# Patient Record
Sex: Male | Born: 2000 | Race: Black or African American | Hispanic: No | Marital: Single | Smoking: Never smoker
Health system: Southern US, Community
[De-identification: ages and names within clinical notes are randomized; demographics above are authoritative.]

## PROBLEM LIST (undated history)

## (undated) DIAGNOSIS — F32A Depression, unspecified: Secondary | ICD-10-CM

## (undated) DIAGNOSIS — R519 Headache, unspecified: Secondary | ICD-10-CM

## (undated) DIAGNOSIS — J039 Acute tonsillitis, unspecified: Secondary | ICD-10-CM

## (undated) DIAGNOSIS — R51 Headache: Secondary | ICD-10-CM

## (undated) DIAGNOSIS — F419 Anxiety disorder, unspecified: Secondary | ICD-10-CM

---
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
## 2021-10-03 ENCOUNTER — Other Ambulatory Visit: Payer: Self-pay

## 2021-10-03 ENCOUNTER — Encounter (HOSPITAL_COMMUNITY): Payer: Self-pay | Admitting: Emergency Medicine

## 2021-10-03 ENCOUNTER — Ambulatory Visit (HOSPITAL_COMMUNITY)
Admission: EM | Admit: 2021-10-03 | Discharge: 2021-10-03 | Disposition: A | Discharge status: Home / Self Care | Payer: BC Managed Care – PPO | Attending: Nurse Practitioner | Admitting: Nurse Practitioner

## 2021-10-03 DIAGNOSIS — F32A Depression, unspecified: Secondary | ICD-10-CM

## 2021-10-03 DIAGNOSIS — F419 Anxiety disorder, unspecified: Secondary | ICD-10-CM

## 2021-10-03 DIAGNOSIS — A084 Viral intestinal infection, unspecified: Secondary | ICD-10-CM

## 2021-10-03 MED ORDER — ONDANSETRON 4 MG PO TBDP: 4.0000 mg | ORAL_TABLET | Freq: Three times a day (TID) | ORAL | 0 refills | Status: DC | PRN

## 2021-10-03 NOTE — ED Triage Notes (Signed)
Abdominal pain for 2 days.  Diarrhea intermittently for 3-4 days.  Reports 1-2 episodes of vomiting.   ?

## 2021-10-03 NOTE — ED Provider Notes (Signed)
?[facility] ? ? ? ?CSN: [id number] ?Arrival date & time: 10/03/21  1058 ? ? ?  ? ?History   ?Chief Complaint ?Chief Complaint  ?Patient presents with  ? Abdominal Pain  ? ? ?HPI ?Joshua Soto is a 21 y.o. male.  ? ?Patient presents with 2 concerns today.  First, he reports he has been suffering with anxiety and depression his whole life.  He has been using marijuana to self treat for his anxiety and depression.  He tells me today he is attempted suicide multiple times in the past.  Today, he is not having any active thoughts of hurting or killing himself.  He also denies any plan to hurt or kill himself.   ? ?Second, he has been having lower abdominal pain, diarrhea, and intermittent vomiting for the past few days.  He denies any blood in his vomit or stool.  He has been drinking plenty of water and this has been staying down, however he tried to eat a burger the other day and he vomited.  He denies chest pain, shortness of breath, and dizziness.   ? ? ?Past Medical History:  ?Diagnosis Date  ? Tonsillitis   ? Worsening headaches   ? ? ?There are no problems to display for this patient. ? ? ?History reviewed. No pertinent surgical history. ? ? ? ? ?Home Medications   ? ?Prior to Admission medications   ?Medication Sig Start Date End Date Taking? Authorizing Provider  ?ondansetron (ZOFRAN-ODT) 4 MG disintegrating tablet Take 1 tablet (4 mg total) by mouth every 8 (eight) hours as needed for nausea or vomiting. 10/03/21  Yes Eulogio Bear, NP  ?cetirizine (ZYRTEC) 10 MG tablet Take 10 mg by mouth daily.    [provider]  ?fluticasone (FLONASE) 50 MCG/ACT nasal spray Place 2 sprays into the nose daily.    [provider]  ?hyoscyamine (ANASPAZ) 0.125 MG TBDP disintergrating tablet Place 1 tablet (0.125 mg total) under the tongue every 6 (six) hours as needed. 02/18/19   Levin Erp, PA  ?Naproxen (NAPROXEN) 375 MG TBEC Take 1 tablet by mouth 2 (two) times daily as needed  (for headache).    [provider]  ? ? ?Family History ?Family History  ?Problem Relation Age of Onset  ? Diabetes Maternal Grandfather   ? Kidney disease Maternal Uncle   ? ? ?Social History ?Social History  ? ?Tobacco Use  ? Smoking status: Never  ? Smokeless tobacco: Never  ?Vaping Use  ? Vaping Use: Every day  ?Substance Use Topics  ? Alcohol use: No  ? Drug use: Yes  ?  Types: Marijuana  ? ? ? ?Allergies   ?Patient has no known allergies. ? ? ?Review of Systems ?Review of Systems ?Per HPI ? ?Physical Exam ?Triage Vital Signs ?ED Triage Vitals  ?Enc Vitals Group  ?   BP 10/03/21 1153 117/71  ?   Pulse Rate 10/03/21 1153 75  ?   Resp 10/03/21 1153 18  ?   Temp 10/03/21 1153 98.4 ?F (36.9 ?C)  ?   Temp Source 10/03/21 1153 Oral  ?   SpO2 10/03/21 1153 97 %  ?   Weight --   ?   Height --   ?   Head Circumference --   ?   Peak Flow --   ?   Pain Score 10/03/21 1148 7  ?   Pain Loc --   ?   Pain Edu? --   ?  Excl. in [place]? --   ? ?No data found. ? ?Updated Vital Signs ?BP 117/71 (BP Location: Right Arm)   Pulse 75   Temp 98.4 ?F (36.9 ?C) (Oral)   Resp 18   SpO2 97%  ? ?Visual Acuity ?Right Eye Distance:   ?Left Eye Distance:   ?Bilateral Distance:   ? ?Right Eye Near:   ?Left Eye Near:    ?Bilateral Near:    ? ?Physical Exam ?Vitals and nursing note reviewed.  ?Constitutional:   ?   General: He is not in acute distress. ?   Appearance: Normal appearance. He is not toxic-appearing.  ?HENT:  ?   Head: Normocephalic and atraumatic.  ?   Mouth/Throat:  ?   Mouth: Mucous membranes are moist.  ?   Pharynx: Oropharynx is clear. No posterior oropharyngeal erythema.  ?Cardiovascular:  ?   Rate and Rhythm: Normal rate and regular rhythm.  ?Pulmonary:  ?   Effort: Pulmonary effort is normal. No respiratory distress.  ?   Breath sounds: Normal breath sounds. No wheezing, rhonchi or rales.  ?Abdominal:  ?   General: Abdomen is flat. Bowel sounds are normal. There is no distension.  ?   Palpations: Abdomen is soft.   ?   Tenderness: There is no abdominal tenderness. There is no right CVA tenderness, left CVA tenderness, guarding or rebound.  ?Musculoskeletal:  ?   Cervical back: Normal range of motion.  ?Lymphadenopathy:  ?   Cervical: No cervical adenopathy.  ?Skin: ?   General: Skin is warm and dry.  ?   Capillary Refill: Capillary refill takes less than 2 seconds.  ?   Coloration: Skin is not jaundiced or pale.  ?   Findings: No erythema.  ?Neurological:  ?   Mental Status: He is alert.  ?   Motor: No weakness.  ?   Gait: Gait normal.  ?Psychiatric:     ?   Attention and Perception: Attention normal.     ?   Mood and Affect: Affect is flat.     ?   Speech: Speech normal.     ?   Behavior: Behavior is cooperative.  ? ? ? ?UC Treatments / Results  ?Labs ?(all labs ordered are listed, but only abnormal results are displayed) ?Labs Reviewed - No data to display ? ?EKG ? ? ?Radiology ?No results found. ? ?Procedures ?Procedures (including critical care time) ? ?Medications Ordered in UC ?Medications - No data to display ? ?Initial Impression / Assessment and Plan / UC Course  ?I have reviewed the triage vital signs and the nursing notes. ? ?Pertinent labs & imaging results that were available during my care of the patient were reviewed by me and considered in my medical decision making (see chart for details). ? ?  ?Suspect viral gastroenteritis as cause of nausea/vomiting, and abdominal pain.  Encouraged plenty of hydration, soft foods, and can use ondansetron 4 mg every 8 hours as needed for nausea/vomiting.  ? ?Regarding anxiety and depression, I encouraged establishing care with primary care provider to discuss medication.  We attempted to make an appointment with a primary care provider during today's office visit, however the patient did not know his work schedule for the next week.  He denies suicidal plan or intent today.  I gave him a list of behavioral health resources and encouraged him to reach out to these resources  if he starts having suicidal plan or intent.  The patient was given the opportunity to  ask questions.  All questions answered to their satisfaction.  The patient is in agreement to this plan.  ? ?Final Clinical Impressions(s) / UC Diagnoses  ? ?Final diagnoses:  ?Anxiety and depression  ?Viral gastroenteritis  ? ? ? ?Discharge Instructions   ? ?  ?- See the list of mental health resources; it is attached to this packet of information ?- Please schedule an appointment with a PCP as soon as you know your work schedule for next week ?- Continue pushing hydration with plenty of fluids.  You can use ondansetron 4 mg under your tongue every 8 hours as needed for nausea/vomiting ?- Diarrhea, abdominal pain, and nausea/vomiting should resolve over the next few days.  Please seek care if this does not occur. ? ? ? ?ED Prescriptions   ? ? Medication Sig Dispense Auth. Provider  ? ondansetron (ZOFRAN-ODT) 4 MG disintegrating tablet Take 1 tablet (4 mg total) by mouth every 8 (eight) hours as needed for nausea or vomiting. 20 tablet Eulogio Bear, NP  ? ?  ? ?PDMP not reviewed this encounter. ?  ?Eulogio Bear, NP ?10/03/21 1301 ? ?

## 2021-10-03 NOTE — Discharge Instructions (Addendum)
-   See the list of mental health resources; it is attached to this packet of information ?- Please schedule an appointment with a PCP as soon as you know your work schedule for next week ?- Continue pushing hydration with plenty of fluids.  You can use ondansetron 4 mg under your tongue every 8 hours as needed for nausea/vomiting ?- Diarrhea, abdominal pain, and nausea/vomiting should resolve over the next few days.  Please seek care if this does not occur. ?

## 2021-10-15 ENCOUNTER — Encounter (HOSPITAL_COMMUNITY): Payer: Self-pay

## 2021-10-15 ENCOUNTER — Emergency Department (HOSPITAL_COMMUNITY)
Admission: EM | Admit: 2021-10-15 | Discharge: 2021-10-15 | Disposition: A | Discharge status: Home / Self Care | Payer: BC Managed Care – PPO | Attending: Student | Admitting: Student

## 2021-10-15 ENCOUNTER — Other Ambulatory Visit: Payer: Self-pay

## 2021-10-15 ENCOUNTER — Emergency Department (HOSPITAL_COMMUNITY): Payer: BC Managed Care – PPO

## 2021-10-15 DIAGNOSIS — D72829 Elevated white blood cell count, unspecified: Secondary | ICD-10-CM | POA: Insufficient documentation

## 2021-10-15 DIAGNOSIS — R404 Transient alteration of awareness: Secondary | ICD-10-CM | POA: Diagnosis not present

## 2021-10-15 DIAGNOSIS — R030 Elevated blood-pressure reading, without diagnosis of hypertension: Secondary | ICD-10-CM | POA: Diagnosis not present

## 2021-10-15 DIAGNOSIS — R0682 Tachypnea, not elsewhere classified: Secondary | ICD-10-CM | POA: Insufficient documentation

## 2021-10-15 DIAGNOSIS — R944 Abnormal results of kidney function studies: Secondary | ICD-10-CM | POA: Insufficient documentation

## 2021-10-15 DIAGNOSIS — R519 Headache, unspecified: Secondary | ICD-10-CM | POA: Insufficient documentation

## 2021-10-15 DIAGNOSIS — R569 Unspecified convulsions: Secondary | ICD-10-CM | POA: Diagnosis not present

## 2021-10-15 DIAGNOSIS — F17200 Nicotine dependence, unspecified, uncomplicated: Secondary | ICD-10-CM | POA: Diagnosis not present

## 2021-10-15 DIAGNOSIS — E871 Hypo-osmolality and hyponatremia: Secondary | ICD-10-CM | POA: Diagnosis not present

## 2021-10-15 DIAGNOSIS — R7401 Elevation of levels of liver transaminase levels: Secondary | ICD-10-CM | POA: Insufficient documentation

## 2021-10-15 DIAGNOSIS — I10 Essential (primary) hypertension: Secondary | ICD-10-CM | POA: Diagnosis not present

## 2021-10-15 DIAGNOSIS — R000 Tachycardia, unspecified: Secondary | ICD-10-CM | POA: Diagnosis not present

## 2021-10-15 LAB — COMPREHENSIVE METABOLIC PANEL
ALT: 69 U/L — ABNORMAL HIGH (ref 0–44)
AST: 114 U/L — ABNORMAL HIGH (ref 15–41)
Albumin: 5 g/dL (ref 3.5–5.0)
Alkaline Phosphatase: 67 U/L (ref 38–126)
Anion gap: 17 — ABNORMAL HIGH (ref 5–15)
BUN: 9 mg/dL (ref 6–20)
CO2: 16 mmol/L — ABNORMAL LOW (ref 22–32)
Calcium: 9.8 mg/dL (ref 8.9–10.3)
Chloride: 100 mmol/L (ref 98–111)
Creatinine, Ser: 1.49 mg/dL — ABNORMAL HIGH (ref 0.61–1.24)
GFR, Estimated: >60 mL/min (ref >60)
Glucose, Bld: 184 mg/dL — ABNORMAL HIGH (ref 70–99)
Potassium: 3.8 mmol/L (ref 3.5–5.1)
Sodium: 133 mmol/L — ABNORMAL LOW (ref 135–145)
Total Bilirubin: 1 mg/dL (ref 0.3–1.2)
Total Protein: 8.2 g/dL — ABNORMAL HIGH (ref 6.5–8.1)

## 2021-10-15 LAB — CBC WITH DIFFERENTIAL/PLATELET
Abs Immature Granulocytes: 0.14 10*3/uL — ABNORMAL HIGH (ref 0.00–0.07)
Basophils Absolute: 0 10*3/uL (ref 0.0–0.1)
Basophils Relative: 0 %
Eosinophils Absolute: 0.1 10*3/uL (ref 0.0–0.5)
Eosinophils Relative: 0 %
HCT: 49.1 % (ref 39.0–52.0)
Hemoglobin: 15.8 g/dL (ref 13.0–17.0)
Immature Granulocytes: 1 %
Lymphocytes Relative: 25 %
Lymphs Abs: 3.8 10*3/uL (ref 0.7–4.0)
MCH: 28.5 pg (ref 26.0–34.0)
MCHC: 32.2 g/dL (ref 30.0–36.0)
MCV: 88.6 fL (ref 80.0–100.0)
Monocytes Absolute: 0.7 10*3/uL (ref 0.1–1.0)
Monocytes Relative: 5 %
Neutro Abs: 10.2 10*3/uL — ABNORMAL HIGH (ref 1.7–7.7)
Neutrophils Relative %: 69 %
Platelets: 231 10*3/uL (ref 150–400)
RBC: 5.54 MIL/uL (ref 4.22–5.81)
RDW: 12.9 % (ref 11.5–15.5)
WBC: 14.9 10*3/uL — ABNORMAL HIGH (ref 4.0–10.5)
nRBC: 0 % (ref 0.0–0.2)

## 2021-10-15 LAB — RAPID URINE DRUG SCREEN, HOSP PERFORMED
Amphetamines: NOT DETECTED
Barbiturates: NOT DETECTED
Benzodiazepines: POSITIVE — AB
Cocaine: NOT DETECTED
Opiates: NOT DETECTED
Tetrahydrocannabinol: POSITIVE — AB

## 2021-10-15 MED ORDER — LACTATED RINGERS IV BOLUS
1000.0000 mL | Freq: Once | INTRAVENOUS | Status: AC
  Administered 2021-10-15: 1000 mL via INTRAVENOUS

## 2021-10-15 MED ORDER — METOCLOPRAMIDE HCL 5 MG/ML IJ SOLN
10.0000 mg | Freq: Once | INTRAMUSCULAR | Status: AC
  Administered 2021-10-15: 10 mg via INTRAVENOUS
  Filled 2021-10-15: qty 2

## 2021-10-15 NOTE — ED Triage Notes (Signed)
Pt smoking marijuana with friends. Pt had 2 minute seizure involving shaking of arms and legs. Pt states he has no history of seizures. ?

## 2021-10-15 NOTE — ED Provider Notes (Signed)
?[facility] EMERGENCY DEPARTMENT ?Provider Note ? ? ?CSN: [id number] ?Arrival date & time: 10/15/21  1955 ? ?  ? ?History ? ?Chief Complaint  ?Patient presents with  ? Seizures  ? ? ?Joshua Soto is a 21 y.o. male. ? ?HPI ? ?Patient is a 21 year old male with a history of chronic headache presents to the emergency department after seizure activity.  Per family member, patient was smoking weed when the incident happened.  They report 2 minutes of generalized jerking motion in his upper and lower extremity.  His mother reports she has put him on the side because he was foaming at the mouth.  Denies any eye deviation.  No reported history of seizures.  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  Patient currently endorses bitemporal headache.  Denies neck pain or fever.  Denies chest pain or shortness of breath.  He does not remember exactly what happened.  No recent sickness.  EMS report he was postictal when they got him.  No intervention prior to arrival. Otherwise no other complaints. ? ?Home Medications ?Prior to Admission medications   ?Medication Sig Start Date End Date Taking? Authorizing Provider  ?cetirizine (ZYRTEC) 10 MG tablet Take 10 mg by mouth daily.    [provider]  ?fluticasone (FLONASE) 50 MCG/ACT nasal spray Place 2 sprays into the nose daily.    [provider]  ?hyoscyamine (ANASPAZ) 0.125 MG TBDP disintergrating tablet Place 1 tablet (0.125 mg total) under the tongue every 6 (six) hours as needed. 02/18/19   Unk Lightning, PA  ?Naproxen (NAPROXEN) 375 MG TBEC Take 1 tablet by mouth 2 (two) times daily as needed (for headache).    [provider]  ?ondansetron (ZOFRAN-ODT) 4 MG disintegrating tablet Take 1 tablet (4 mg total) by mouth every 8 (eight) hours as needed for nausea or vomiting. 10/03/21   Valentino Nose, NP  ?   ? ?Allergies    ?Patient has no known allergies.   ? ?Review of Systems   ?Review of  Systems ? ?Physical Exam ?Updated Vital Signs ?BP (!) 108/95   Pulse 63   Temp 97.7 ?F (36.5 ?C)   Resp 20   Ht 5\' 10"  (1.778 m)   Wt 74 kg   SpO2 100%   BMI 23.41 kg/m?  ?Physical Exam ?Vitals and nursing note reviewed.  ?Constitutional:   ?   General: He is not in acute distress. ?   Appearance: He is well-developed.  ?   Comments: Tired appearing  ?HENT:  ?   Head: Normocephalic and atraumatic.  ?   Comments: Tongue bite marks without active bleeding or laceration ?Eyes:  ?   Conjunctiva/sclera: Conjunctivae normal.  ?Cardiovascular:  ?   Rate and Rhythm: Normal rate and regular rhythm.  ?   Heart sounds: No murmur heard. ?Pulmonary:  ?   Effort: Pulmonary effort is normal. No respiratory distress.  ?   Breath sounds: Normal breath sounds.  ?Abdominal:  ?   Palpations: Abdomen is soft.  ?   Tenderness: There is no abdominal tenderness.  ?Musculoskeletal:     ?   General: No swelling or tenderness.  ?   Cervical back: Neck supple.  ?Skin: ?   General: Skin is warm and dry.  ?   Capillary Refill: Capillary refill takes less than 2 seconds.  ?Neurological:  ?   General: No focal deficit present.  ?   Mental Status: He is alert and  oriented to person, place, and time.  ?   Sensory: No sensory deficit.  ?   Comments: Tired appearing, however alert and oriented x3.  Follows command.  GCS of 15.  ?Psychiatric:     ?   Mood and Affect: Mood normal.  ? ? ?ED Results / Procedures / Treatments   ?Labs ?(all labs ordered are listed, but only abnormal results are displayed) ?Labs Reviewed  ?COMPREHENSIVE METABOLIC PANEL - Abnormal; Notable for the following components:  ?    Result Value  ? Sodium 133 (*)   ? CO2 16 (*)   ? Glucose, Bld 184 (*)   ? Creatinine, Ser 1.49 (*)   ? Total Protein 8.2 (*)   ? AST 114 (*)   ? ALT 69 (*)   ? Anion gap 17 (*)   ? All other components within normal limits  ?CBC WITH DIFFERENTIAL/PLATELET - Abnormal; Notable for the following components:  ? WBC 14.9 (*)   ? Neutro Abs 10.2 (*)   ?  Abs Immature Granulocytes 0.14 (*)   ? All other components within normal limits  ?RAPID URINE DRUG SCREEN, HOSP PERFORMED - Abnormal; Notable for the following components:  ? Benzodiazepines POSITIVE (*)   ? Tetrahydrocannabinol POSITIVE (*)   ? All other components within normal limits  ? ? ?EKG ?None ? ?Radiology ?CT Head Wo Contrast ? ?Result Date: 10/15/2021 ?CLINICAL DATA:  Seizure disorder EXAM: CT HEAD WITHOUT CONTRAST TECHNIQUE: Contiguous axial images were obtained from the base of the skull through the vertex without intravenous contrast. RADIATION DOSE REDUCTION: This exam was performed according to the departmental dose-optimization program which includes automated exposure control, adjustment of the mA and/or kV according to patient size and/or use of iterative reconstruction technique. COMPARISON:  02/28/2013 FINDINGS: Brain: No acute intracranial findings are seen. There are no signs of bleeding within the cranium. Ventricles are not dilated. There is no focal mass effect. Vascular: Unremarkable. Skull: Unremarkable. Sinuses/Orbits: There is mucosal thickening in the ethmoid and right maxillary sinuses. Other: None IMPRESSION: No acute intracranial findings are seen in noncontrast CT brain. Chronic ethmoid and right maxillary sinusitis. Electronically Signed   By: Ernie Avena M.D.   On: 10/15/2021 20:57   ? ?Procedures ?Procedures  ? ?Medications Ordered in ED ?Medications  ?lactated ringers bolus 1,000 mL (1,000 mLs Intravenous New Bag/Given 10/15/21 2111)  ?metoCLOPramide (REGLAN) injection 10 mg (10 mg Intravenous Given 10/15/21 2111)  ? ? ?ED Course/ Medical Decision Making/ A&P ?  ?                        ?Medical Decision Making ?Problems Addressed: ?Seizure [facility]): acute illness or injury that poses a threat to life or bodily functions ? ?Amount and/or Complexity of Data Reviewed ?Labs: ordered. Decision-making details documented in ED Course. ?Radiology: ordered and independent  interpretation performed. Decision-making details documented in ED Course. ? ?Risk ?Prescription drug management. ? ? ?Patient is a 21 year old male presents to the emergency department for 2 minutes of seizure.  EMS reported sugar was 120.  On arrival, he is GCS 15.  He does appear tired.  Vital signs are remarkable for mild tachypnea and mildly elevated blood pressure.  No systemic signs such as tachycardia or fever.  No focal deficit on my exam. ? ?Patient seizure most likely in the setting of THC use/tox mediated.  I have low clinical suspicion for meningitis or other serious intracranial etiology at this time as his  CT head did not show any acute etiology.  Less concern for cancerous etiology causing seizure at this time.  No meningeal signs on my exam.  Less concern for meningitis at this time.  His CBC shows mildly elevated leukocyte at 14.9.  CMP showed mild hyponatremia.  Creatinine is also mildly elevated.  No baseline to compare at this time.  AST and ALT are mildly elevated.  UDS positive for benzo and THC. ? ?Give patient Reglan and LR bolus for symptomatic management of his headache.  ? ?Believe seizure most likely in the setting of tach/substance use.  Before discharge, patient is back to baseline.  Informed him to follow-up with PCP in 1 to 2 days.  Strict return precaution has been discussed. ? ? ?Final Clinical Impression(s) / ED Diagnoses ?Final diagnoses:  ?Seizure (HCC)  ? ? ?Rx / DC Orders ?ED Discharge Orders   ? ? None  ? ?  ? ? ?  ?Amayia Ciano, TewodrJari Sportsmanos T, MD ?10/15/21 2234 ? ?  ?Glendora ScoreKommor, [place], MD ?10/15/21 2333 ? ?

## 2021-10-30 ENCOUNTER — Encounter: Payer: Self-pay | Admitting: Physician Assistant

## 2021-10-30 ENCOUNTER — Ambulatory Visit: Payer: BC Managed Care – PPO | Admitting: Physician Assistant

## 2021-10-30 VITALS — BP 112/74 | HR 98 | Temp 98.2°F | Ht 70.0 in | Wt 181.2 lb

## 2021-10-30 DIAGNOSIS — F32A Depression, unspecified: Secondary | ICD-10-CM | POA: Insufficient documentation

## 2021-10-30 DIAGNOSIS — F419 Anxiety disorder, unspecified: Secondary | ICD-10-CM | POA: Diagnosis not present

## 2021-10-30 DIAGNOSIS — R799 Abnormal finding of blood chemistry, unspecified: Secondary | ICD-10-CM

## 2021-10-30 DIAGNOSIS — D72829 Elevated white blood cell count, unspecified: Secondary | ICD-10-CM | POA: Diagnosis not present

## 2021-10-30 DIAGNOSIS — Z87898 Personal history of other specified conditions: Secondary | ICD-10-CM | POA: Insufficient documentation

## 2021-10-30 DIAGNOSIS — J309 Allergic rhinitis, unspecified: Secondary | ICD-10-CM | POA: Insufficient documentation

## 2021-10-30 DIAGNOSIS — Z6826 Body mass index (BMI) 26.0-26.9, adult: Secondary | ICD-10-CM

## 2021-10-30 MED ORDER — ALPRAZOLAM 0.5 MG PO TABS: 0.5000 mg | ORAL_TABLET | Freq: Two times a day (BID) | ORAL | 0 refills | Status: DC | PRN

## 2021-10-30 MED ORDER — BUPROPION HCL 75 MG PO TABS: 75.0000 mg | ORAL_TABLET | Freq: Every day | ORAL | 3 refills | Status: DC

## 2021-10-30 NOTE — Progress Notes (Signed)
? ?New Patient Office Visit ? ?Subjective:  ?Patient ID: Joshua Soto, male    DOB: 2000-09-19  Age: 21 y.o. MRN: [id number] ?0001 ? ?C:  ?Chief Complaint  ?Patient presents with  ? other  ?  New pt. Est. Also recently in the hospital had a seizure at apartment, stood up on phone and just feel over started seizing up, that was the first time that ever happened. After seizure bit tongue still a little numb. Pt. Stated he had not eaten in 2 days and had been awake for 2 days before he had the seizure that day.   ? ? ?HPI ?Joshua Soto presents to establish care and to follow up a visit to the ED after having a seizure; reports he had several days of not eating or sleeping and had stopped taking xanax for about a week when he had the seizure; states he has PTSD, a lot of anxiety and some depression, and has seen a lot in his lifetime; reports a history of a suicide attempt about 4 years ago, but denies suicidal ideation today; lost his Grandfather a few years ago; tried therapy years ago and went for 2 months but didn't find it helpful; for the past week has been sleeping about 6 hours at a time, but sometimes stays up all night; reports that he has nightmares due to anxiety or fears that he or a loved one will die; eats usually 2 times a day but sometimes only 1, also snacks. ? ? ?Outpatient Encounter Medications as of 10/30/2021  ?Medication Sig  ? albuterol (PROAIR HFA) 108 (90 Base) MCG/ACT inhaler INL 2 PFS PO Q 4 TO 6 H PRN  ? ALPRAZolam (XANAX) 0.5 MG tablet Take 1 tablet (0.5 mg total) by mouth 2 (two) times daily as needed for anxiety.  ? buPROPion (WELLBUTRIN) 75 MG tablet Take 1 tablet (75 mg total) by mouth daily.  ? cetirizine (ZYRTEC) 10 MG tablet   ? fluticasone (FLONASE) 50 MCG/ACT nasal spray Place 2 sprays into the nose daily.  ? fluticasone (FLOVENT HFA) 110 MCG/ACT inhaler INL 2 PFS PO BID  ? [DISCONTINUED] cetirizine (ZYRTEC) 10 MG tablet Take 10 mg by mouth daily.  ? [DISCONTINUED] fluticasone  (FLONASE) 50 MCG/ACT nasal spray Place into the nose.  ? [DISCONTINUED] hyoscyamine (ANASPAZ) 0.125 MG TBDP disintergrating tablet Place 1 tablet (0.125 mg total) under the tongue every 6 (six) hours as needed. (Patient not taking: Reported on 10/30/2021)  ? [DISCONTINUED] Naproxen 375 MG TBEC Take 1 tablet by mouth 2 (two) times daily as needed (for headache). (Patient not taking: Reported on 10/30/2021)  ? [DISCONTINUED] ondansetron (ZOFRAN-ODT) 4 MG disintegrating tablet Take 1 tablet (4 mg total) by mouth every 8 (eight) hours as needed for nausea or vomiting. (Patient not taking: Reported on 10/30/2021)  ? ?No facility-administered encounter medications on file as of 10/30/2021.  ? ? ?Past Medical History:  ?Diagnosis Date  ? Tonsillitis   ? Worsening headaches   ? ? ?History reviewed. No pertinent surgical history. ? ?Family History  ?Problem Relation Age of Onset  ? Diabetes Maternal Grandfather   ? Kidney disease Maternal Uncle   ? ? ?Social History  ? ?Socioeconomic History  ? Marital status: Single  ?  Spouse name: Not on file  ? Number of children: Not on file  ? Years of education: Not on file  ? Highest education level: Not on file  ?Occupational History  ? Not on file  ?Tobacco Use  ?  Smoking status: Never  ? Smokeless tobacco: Never  ?Vaping Use  ? Vaping Use: Every day  ?Substance and Sexual Activity  ? Alcohol use: No  ? Drug use: Yes  ?  Types: Marijuana  ? Sexual activity: Not on file  ?Other Topics Concern  ? Not on file  ?Social History Narrative  ? Not on file  ? ?Social Determinants of Health  ? ?Financial Resource Strain: Not on file  ?Food Insecurity: Not on file  ?Transportation Needs: Not on file  ?Physical Activity: Not on file  ?Stress: Not on file  ?Social Connections: Not on file  ?Intimate Partner Violence: Not on file  ? ? ?ROS ?Review of Systems  ?Constitutional:  Positive for fatigue. Negative for activity change, chills and fever.  ?HENT:  Negative for congestion, ear pain, hearing  loss and voice change.   ?Eyes:  Negative for pain and redness.  ?Respiratory:  Negative for cough and shortness of breath.   ?Cardiovascular:  Negative for leg swelling.  ?Gastrointestinal:  Negative for constipation, diarrhea, nausea and vomiting.  ?Endocrine: Negative for polyuria.  ?Genitourinary:  Negative for flank pain and frequency.  ?Musculoskeletal:  Negative for joint swelling and neck pain.  ?Skin:  Negative for rash.  ?Neurological:  Negative for dizziness.  ?Hematological:  Does not bruise/bleed easily.  ?Psychiatric/Behavioral:  Positive for sleep disturbance. Negative for agitation, behavioral problems and suicidal ideas. The patient is nervous/anxious.   ? ?Objective:  ? ?Today's Vitals: BP 112/74   Pulse 98   Temp 98.2 ?F (36.8 ?C)   Wt 181 lb 3.2 oz (82.2 kg)   BMI 26.00 kg/m?  ? ?Physical Exam ?Vitals and nursing note reviewed.  ?Constitutional:   ?   General: He is not in acute distress. ?   Appearance: Normal appearance.  ?HENT:  ?   Head: Normocephalic and atraumatic.  ?   Right Ear: External ear normal.  ?   Left Ear: External ear normal.  ?   Nose: No congestion.  ?Eyes:  ?   Extraocular Movements: Extraocular movements intact.  ?   Conjunctiva/sclera: Conjunctivae normal.  ?   Pupils: Pupils are equal, round, and reactive to light.  ?Cardiovascular:  ?   Rate and Rhythm: Normal rate and regular rhythm.  ?   Pulses: Normal pulses.  ?   Heart sounds: Normal heart sounds.  ?Pulmonary:  ?   Effort: Pulmonary effort is normal.  ?   Breath sounds: Normal breath sounds. No wheezing.  ?Abdominal:  ?   General: Bowel sounds are normal.  ?   Palpations: Abdomen is soft.  ?Musculoskeletal:     ?   General: Normal range of motion.  ?   Cervical back: Normal range of motion and neck supple.  ?   Right lower leg: No edema.  ?   Left lower leg: No edema.  ?Skin: ?   General: Skin is warm and dry.  ?   Findings: No rash.  ?Neurological:  ?   Mental Status: He is alert and oriented to person, place, and  time.  ?   Gait: Gait normal.  ?Psychiatric:     ?   Mood and Affect: Mood normal.     ?   Behavior: Behavior normal.     ?   Thought Content: Thought content normal.     ?   Judgment: Judgment normal.  ? ? ? ? ?Assessment & Plan:  ? ?Problem List Items Addressed This Visit   ? ?  ?  Other  ? Anxiety and depression  ?  Stable, patient willing to try Wellbutrin 75 mg daily, will prescribe xanax 0.5 mg bid prn, #40 (20 day supply), return in 2 weeks for follow up; also referred to [facility], [place]  ?[address], [place], [place] [address]  ?[phone] ? ?  ?  ? Relevant Medications  ? buPROPion (WELLBUTRIN) 75 MG tablet  ? ALPRAZolam (XANAX) 0.5 MG tablet  ? Other Relevant Orders  ? Ambulatory referral to Psychiatry  ? History of seizure - Primary  ?  Seizure diagnosed in the ED on 10/15/2021; will refer to Neurology for further evaluation ? ?  ?  ? Relevant Orders  ? CBC with Differential/Platelet (Completed)  ? Comprehensive metabolic panel (Completed)  ? Ambulatory referral to Neurology  ? Ambulatory referral to Psychiatry  ? Body mass index (BMI) of 26.0 to 26.9 in adult  ?  Stable, will monitor ? ?  ?  ? ?Other Visit Diagnoses   ? ? Abnormal blood chemistry      ? Relevant Orders  ? CBC with Differential/Platelet (Completed)  ? Comprehensive metabolic panel (Completed)  ? Leukocytosis, unspecified type      ? Relevant Orders  ? CBC with Differential/Platelet (Completed)  ? ?  ? ?I have logged into and reviewed this patient's PMPAware data today prior to issuing prescriptions for any controlled substances. ? ? ? ?Follow-up: Return in about 2 weeks (around 11/13/2021) for Return for a Follow Up Appointment Chronic Disease.  ? ?Jake Shark, PA-C ?

## 2021-10-30 NOTE — Patient Instructions (Addendum)
You will get a call to schedule an appointment with Neurology and [facility] ?

## 2021-10-31 DIAGNOSIS — Z6826 Body mass index (BMI) 26.0-26.9, adult: Secondary | ICD-10-CM | POA: Insufficient documentation

## 2021-10-31 LAB — CBC WITH DIFFERENTIAL/PLATELET
Basophils Absolute: 0 10*3/uL (ref 0.0–0.2)
Basos: 0 %
EOS (ABSOLUTE): 0.1 10*3/uL (ref 0.0–0.4)
Eos: 1 %
Hematocrit: 45.6 % (ref 37.5–51.0)
Hemoglobin: 15.4 g/dL (ref 13.0–17.7)
Immature Grans (Abs): 0 10*3/uL (ref 0.0–0.1)
Immature Granulocytes: 0 %
Lymphocytes Absolute: 3 10*3/uL (ref 0.7–3.1)
Lymphs: 44 %
MCH: 28.4 pg (ref 26.6–33.0)
MCHC: 33.8 g/dL (ref 31.5–35.7)
MCV: 84 fL (ref 79–97)
Monocytes Absolute: 0.5 10*3/uL (ref 0.1–0.9)
Monocytes: 7 %
Neutrophils Absolute: 3.2 10*3/uL (ref 1.4–7.0)
Neutrophils: 48 %
Platelets: 335 10*3/uL (ref 150–450)
RBC: 5.42 x10E6/uL (ref 4.14–5.80)
RDW: 12.8 % (ref 11.6–15.4)
WBC: 6.9 10*3/uL (ref 3.4–10.8)

## 2021-10-31 LAB — COMPREHENSIVE METABOLIC PANEL
ALT: 30 IU/L (ref 0–44)
AST: 28 IU/L (ref 0–40)
Albumin/Globulin Ratio: 1.8 (ref 1.2–2.2)
Albumin: 5.1 g/dL (ref 4.1–5.2)
Alkaline Phosphatase: 95 IU/L (ref 51–125)
BUN/Creatinine Ratio: 7 — ABNORMAL LOW (ref 9–20)
BUN: 8 mg/dL (ref 6–20)
Bilirubin Total: 0.3 mg/dL (ref 0.0–1.2)
CO2: 24 mmol/L (ref 20–29)
Calcium: 10.1 mg/dL (ref 8.7–10.2)
Chloride: 102 mmol/L (ref 96–106)
Creatinine, Ser: 1.08 mg/dL (ref 0.76–1.27)
Globulin, Total: 2.9 g/dL (ref 1.5–4.5)
Glucose: 83 mg/dL (ref 70–99)
Potassium: 4.9 mmol/L (ref 3.5–5.2)
Sodium: 140 mmol/L (ref 134–144)
Total Protein: 8 g/dL (ref 6.0–8.5)
eGFR: 101 mL/min/{1.73_m2} (ref >59)

## 2021-10-31 NOTE — Progress Notes (Signed)
Please call patient to tell him: ? ?Regarding your recent blood work: ? ?Blood counts are fine, no anemia, no sign of infection ? ?Liver and kidneys are fine ?

## 2021-10-31 NOTE — Assessment & Plan Note (Signed)
Seizure diagnosed in the ED on 10/15/2021; will refer to Neurology for further evaluation ?

## 2021-10-31 NOTE — Assessment & Plan Note (Signed)
Stable, will monitor 

## 2021-10-31 NOTE — Assessment & Plan Note (Signed)
Stable, patient willing to try Wellbutrin 75 mg daily, will prescribe xanax 0.5 mg bid prn, #40 (20 day supply), return in 2 weeks for follow up; also referred to [facility], [place]  ?[address], [place], [place] [address]  ?[phone] ? ?

## 2021-11-09 ENCOUNTER — Ambulatory Visit: Payer: BC Managed Care – PPO | Admitting: Neurology

## 2021-11-09 ENCOUNTER — Encounter: Payer: Self-pay | Admitting: Neurology

## 2021-11-14 ENCOUNTER — Ambulatory Visit: Payer: BC Managed Care – PPO | Admitting: Physician Assistant

## 2021-11-14 NOTE — Progress Notes (Deleted)
Established Patient Office Visit  Subjective:  Patient ID: PETER KEYWORTH, male    DOB: 08/02/2000  Age: 21 y.o. MRN: [id number]  CC: No chief complaint on file.   HPI HERMANN DOTTAVIO presents for ***  Outpatient Medications Prior to Visit  Medication Sig Dispense Refill   albuterol (PROAIR HFA) 108 (90 Base) MCG/ACT inhaler INL 2 PFS PO Q 4 TO 6 H PRN     ALPRAZolam (XANAX) 0.5 MG tablet Take 1 tablet (0.5 mg total) by mouth 2 (two) times daily as needed for anxiety. 40 tablet 0   buPROPion (WELLBUTRIN) 75 MG tablet Take 1 tablet (75 mg total) by mouth daily. 30 tablet 3   cetirizine (ZYRTEC) 10 MG tablet      fluticasone (FLONASE) 50 MCG/ACT nasal spray Place 2 sprays into the nose daily.     fluticasone (FLOVENT HFA) 110 MCG/ACT inhaler INL 2 PFS PO BID     No facility-administered medications prior to visit.    No Known Allergies  Patient Care Team: Marcellina Millin as PCP - General (Physician Assistant)  ROS Review of Systems    Objective:    Physical Exam  There were no vitals taken for this visit.  Wt Readings from Last 3 Encounters:  10/30/21 181 lb 3.2 oz (82.2 kg)  10/15/21 163 lb 2.3 oz (74 kg)  04/30/19 165 lb (74.8 kg) (72 %, Z= 0.58)*   * Growth percentiles are based on CDC (Boys, 2-20 Years) data.    Results for orders placed or performed in visit on 10/30/21  CBC with Differential/Platelet  Result Value Ref Range   WBC 6.9 3.4 - 10.8 x10E3/uL   RBC 5.42 4.14 - 5.80 x10E6/uL   Hemoglobin 15.4 13.0 - 17.7 g/dL   Hematocrit 45.6 37.5 - 51.0 %   MCV 84 79 - 97 fL   MCH 28.4 26.6 - 33.0 pg   MCHC 33.8 31.5 - 35.7 g/dL   RDW 12.8 11.6 - 15.4 %   Platelets 335 150 - 450 x10E3/uL   Neutrophils 48 Not Estab. %   Lymphs 44 Not Estab. %   Monocytes 7 Not Estab. %   Eos 1 Not Estab. %   Basos 0 Not Estab. %   Neutrophils Absolute 3.2 1.4 - 7.0 x10E3/uL   Lymphocytes Absolute 3.0 0.7 - 3.1 x10E3/uL   Monocytes Absolute 0.5 0.1 - 0.9  x10E3/uL   EOS (ABSOLUTE) 0.1 0.0 - 0.4 x10E3/uL   Basophils Absolute 0.0 0.0 - 0.2 x10E3/uL   Immature Granulocytes 0 Not Estab. %   Immature Grans (Abs) 0.0 0.0 - 0.1 x10E3/uL  Comprehensive metabolic panel  Result Value Ref Range   Glucose 83 70 - 99 mg/dL   BUN 8 6 - 20 mg/dL   Creatinine, Ser 1.08 0.76 - 1.27 mg/dL   eGFR 101 >59 mL/min/1.73   BUN/Creatinine Ratio 7 (L) 9 - 20   Sodium 140 134 - 144 mmol/L   Potassium 4.9 3.5 - 5.2 mmol/L   Chloride 102 96 - 106 mmol/L   CO2 24 20 - 29 mmol/L   Calcium 10.1 8.7 - 10.2 mg/dL   Total Protein 8.0 6.0 - 8.5 g/dL   Albumin 5.1 4.1 - 5.2 g/dL   Globulin, Total 2.9 1.5 - 4.5 g/dL   Albumin/Globulin Ratio 1.8 1.2 - 2.2   Bilirubin Total 0.3 0.0 - 1.2 mg/dL   Alkaline Phosphatase 95 51 - 125 IU/L   AST 28 0 - 40 IU/L  ALT 30 0 - 44 IU/L     {Labs (Optional):23779}  The ASCVD Risk score (Arnett DK, et al., 2019) failed to calculate for the following reasons:   The 2019 ASCVD risk score is only valid for ages 76 to 31    Assessment & Plan:   Problem List Items Addressed This Visit   None   No orders of the defined types were placed in this encounter.   Follow-up: No follow-ups on file.    Irene Pap, PA-C

## 2021-11-20 ENCOUNTER — Encounter: Payer: Self-pay | Admitting: Physician Assistant

## 2021-12-05 ENCOUNTER — Emergency Department (HOSPITAL_COMMUNITY): Payer: BC Managed Care – PPO

## 2021-12-05 ENCOUNTER — Encounter (HOSPITAL_COMMUNITY): Payer: Self-pay

## 2021-12-05 ENCOUNTER — Other Ambulatory Visit: Payer: Self-pay

## 2021-12-05 ENCOUNTER — Emergency Department (HOSPITAL_COMMUNITY)
Admission: EM | Admit: 2021-12-05 | Discharge: 2021-12-05 | Disposition: A | Discharge status: Home / Self Care | Payer: BC Managed Care – PPO | Attending: Emergency Medicine | Admitting: Emergency Medicine

## 2021-12-05 DIAGNOSIS — M542 Cervicalgia: Secondary | ICD-10-CM | POA: Insufficient documentation

## 2021-12-05 DIAGNOSIS — S0990XA Unspecified injury of head, initial encounter: Secondary | ICD-10-CM | POA: Diagnosis not present

## 2021-12-05 DIAGNOSIS — R519 Headache, unspecified: Secondary | ICD-10-CM | POA: Insufficient documentation

## 2021-12-05 DIAGNOSIS — Z5321 Procedure and treatment not carried out due to patient leaving prior to being seen by health care provider: Secondary | ICD-10-CM | POA: Diagnosis not present

## 2021-12-05 DIAGNOSIS — S199XXA Unspecified injury of neck, initial encounter: Secondary | ICD-10-CM | POA: Diagnosis not present

## 2021-12-05 DIAGNOSIS — R0781 Pleurodynia: Secondary | ICD-10-CM | POA: Insufficient documentation

## 2021-12-05 DIAGNOSIS — Y92410 Unspecified street and highway as the place of occurrence of the external cause: Secondary | ICD-10-CM | POA: Diagnosis not present

## 2021-12-05 HISTORY — DX: Anxiety disorder, unspecified: F41.9

## 2021-12-05 HISTORY — DX: Depression, unspecified: F32.A

## 2021-12-05 MED ORDER — OXYCODONE-ACETAMINOPHEN 5-325 MG PO TABS
1.0000 | ORAL_TABLET | Freq: Once | ORAL | Status: AC
  Administered 2021-12-05: 1 via ORAL
  Filled 2021-12-05: qty 1
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
## 2021-12-05 NOTE — ED Notes (Signed)
Patient states he is leaving d/t wait time 
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
## 2022-06-01 ENCOUNTER — Encounter (HOSPITAL_COMMUNITY): Payer: Self-pay

## 2022-06-01 ENCOUNTER — Ambulatory Visit (HOSPITAL_COMMUNITY)
Admission: RE | Admit: 2022-06-01 | Discharge: 2022-06-01 | Disposition: A | Discharge status: Home / Self Care | Payer: 59 | Source: Ambulatory Visit | Attending: Emergency Medicine | Admitting: Emergency Medicine

## 2022-06-01 ENCOUNTER — Ambulatory Visit (INDEPENDENT_AMBULATORY_CARE_PROVIDER_SITE_OTHER): Payer: 59

## 2022-06-01 VITALS — BP 136/85 | HR 92 | Temp 97.5°F | Resp 18

## 2022-06-01 DIAGNOSIS — W2209XA Striking against other stationary object, initial encounter: Secondary | ICD-10-CM | POA: Diagnosis not present

## 2022-06-01 DIAGNOSIS — S62367A Nondisplaced fracture of neck of fifth metacarpal bone, left hand, initial encounter for closed fracture: Secondary | ICD-10-CM | POA: Diagnosis not present

## 2022-06-01 DIAGNOSIS — M79642 Pain in left hand: Secondary | ICD-10-CM | POA: Diagnosis not present

## 2022-06-01 MED ORDER — OXYCODONE-ACETAMINOPHEN 5-325 MG PO TABS: 1.0000 | ORAL_TABLET | Freq: Four times a day (QID) | ORAL | 0 refills | Status: DC | PRN

## 2022-06-01 NOTE — ED Provider Notes (Signed)
[facility]    CSN: [id number] Arrival date & time: 06/01/22  1149      History   Chief Complaint Chief Complaint  Patient presents with   Hand Pain    HPI Joshua Soto is a 21 y.o. male.  Patient presents complaining of left hand pain that started yesterday.  Patient reports onset of symptoms began after he punched a wall due to being angry.  Patient reports abrasions are present to his right hand.  Patient reports difficulty with making a fist with his left hand . Patient denies any numbness or tingling in his right or left hand.  Patient states that he took a Xanax which calmed him down but did not totally take the pain away from his left hand.  Patient denies any previous surgery in his left hand.    Hand Pain    Past Medical History:  Diagnosis Date   Anxiety    Depression    Tonsillitis    Worsening headaches     Patient Active Problem List   Diagnosis Date Noted   Body mass index (BMI) of 26.0 to 26.9 in adult 10/31/2021   Allergic rhinitis 10/30/2021   Anxiety and depression 10/30/2021   History of seizure 10/30/2021   Tinea versicolor 01/19/2018   Scoliosis 06/06/2015   Gynecomastia 01/16/2015   Asthma 09/25/2013   Common migraine 02/21/2013    History reviewed. No pertinent surgical history.     Home Medications    Prior to Admission medications   Medication Sig Start Date End Date Taking? Authorizing Provider  oxyCODONE-acetaminophen (PERCOCET/ROXICET) 5-325 MG tablet Take 1 tablet by mouth every 6 (six) hours as needed for severe pain. 06/01/22  Yes Debby Freiberg, NP  albuterol (PROAIR HFA) 108 (90 Base) MCG/ACT inhaler INL 2 PFS PO Q 4 TO 6 H PRN 03/19/17   [provider]  ALPRAZolam (XANAX) 0.5 MG tablet Take 1 tablet (0.5 mg total) by mouth 2 (two) times daily as needed for anxiety. 10/30/21   Jake Shark, PA-C  buPROPion (WELLBUTRIN) 75 MG tablet Take 1 tablet (75 mg total) by mouth daily. 10/30/21 11/29/21   Jake Shark, PA-C  cetirizine (ZYRTEC) 10 MG tablet  03/19/17   [provider]  fluticasone (FLONASE) 50 MCG/ACT nasal spray Place 2 sprays into the nose daily.    [provider]  fluticasone (FLOVENT HFA) 110 MCG/ACT inhaler INL 2 PFS PO BID 03/20/17   [provider]    Family History Family History  Problem Relation Age of Onset   Diabetes Maternal Grandfather    Kidney disease Maternal Uncle     Social History Social History   Tobacco Use   Smoking status: Never   Smokeless tobacco: Never  Vaping Use   Vaping Use: Every day  Substance Use Topics   Alcohol use: No   Drug use: Yes    Types: Marijuana     Allergies   Patient has no known allergies.   Review of Systems Review of Systems Per HPI  Physical Exam Triage Vital Signs ED Triage Vitals  Enc Vitals Group     BP 06/01/22 1207 136/85     Pulse Rate 06/01/22 1207 92     Resp 06/01/22 1207 18     Temp 06/01/22 1207 (!) 97.5 F (36.4 C)     Temp Source 06/01/22 1207 Oral     SpO2 06/01/22 1207 99 %     Weight --  Height --      Head Circumference --      Peak Flow --      Pain Score 06/01/22 1205 7     Pain Loc --      Pain Edu? --      Excl. in GC? --    No data found.  Updated Vital Signs BP 136/85 (BP Location: Right Arm)   Pulse 92   Temp (!) 97.5 F (36.4 C) (Oral)   Resp 18   SpO2 99%      Physical Exam Vitals and nursing note reviewed.  Constitutional:      Appearance: Normal appearance.  Cardiovascular:     Pulses:          Radial pulses are 2+ on the right side and 2+ on the left side.  Musculoskeletal:     Right wrist: Normal. No swelling, deformity, effusion, lacerations, tenderness, bony tenderness, snuff box tenderness or crepitus. Normal range of motion. Normal pulse.     Left wrist: Normal. No swelling, deformity, effusion, lacerations, tenderness, bony tenderness, snuff box tenderness or crepitus. Normal range of motion. Normal pulse.      Right hand: No swelling, deformity, lacerations, tenderness or bony tenderness. Normal range of motion. Normal strength. Normal sensation. Normal capillary refill. Normal pulse.     Left hand: Swelling, tenderness and bony tenderness present. No lacerations. Decreased range of motion. Decreased strength of finger abduction. Normal sensation. Normal capillary refill. Normal pulse.     Comments: LFT Hand: Bruising present to lateral side of hand on the palmar and dorsal side.   Neurological:     Mental Status: He is alert.      UC Treatments / Results  Labs (all labs ordered are listed, but only abnormal results are displayed) Labs Reviewed - No data to display  EKG   Radiology DG Hand Complete Left  Result Date: 06/01/2022 CLINICAL DATA:  Trauma.  Punched a wall. EXAM: LEFT HAND - COMPLETE 3+ VIEW COMPARISON:  Left wrist radiographs 01/23/2014. FINDINGS: Normal bone mineralization. There is a new acute fracture of the fifth metacarpal neck with mild dorsal apex angulation but otherwise no significant displacement. Mild impaction. Joint spaces are preserved. Neutral ulnar variance. No dislocation. IMPRESSION: Acute fracture of the fifth metacarpal neck with mild dorsal apex angulation and impaction. Electronically Signed   By: Neita Garnet M.D.   On: 06/01/2022 12:49    Procedures Procedures (including critical care time)  Medications Ordered in UC Medications - No data to display  Initial Impression / Assessment and Plan / UC Course  I have reviewed the triage vital signs and the nursing notes.  Pertinent labs & imaging results that were available during my care of the patient were reviewed by me and considered in my medical decision making (see chart for details).     Patient was evaluated for a fifth metacarpal neck fracture of LFT hand which was shown on X-Ray. Ulnar gutter splint was placed. Patient was made aware to follow up with an orthopedic provider.  Patient stated  that he has been to [facility] and would like to follow-up with this clinic.  Percocet was sent for severe pain management.  Patient was made aware of safety precautions with this medication and was made aware to discontinue Xanax use while taking this medication.  Patient was educated on possible healing time for fracture . Patient verbalized understanding of instructions.   Charting was provided using a a verbal dictation system, charting  was proofread for errors, errors may occur which could change the meaning of the information charted.   Final Clinical Impressions(s) / UC Diagnoses   Final diagnoses:  Closed nondisplaced fracture of neck of fifth metacarpal bone of left hand, initial encounter     Discharge Instructions      Please follow-up with the orthopedic provider listed on your discharge paperwork, call and schedule an appointment.   Percocet was sent to the pharmacy for severe pain, can be used every 6 hours as needed for the next 3 days for pain management, avoid use of heavy machinery or driving a car while taking this medicine.  Please make sure to stay adequately hydrated, this medication can cause constipation.  Discontinue use of the Xanax while taking the pain medicine.      ED Prescriptions     Medication Sig Dispense Auth. Provider   oxyCODONE-acetaminophen (PERCOCET/ROXICET) 5-325 MG tablet Take 1 tablet by mouth every 6 (six) hours as needed for severe pain. 18 tablet Debby Freiberg, NP      I have reviewed the PDMP during this encounter.   Debby Freiberg, NP 06/01/22 1651    Debby Freiberg, NP 06/07/22 [phone]

## 2022-06-01 NOTE — Discharge Instructions (Addendum)
Please follow-up with the orthopedic provider listed on your discharge paperwork, call and schedule an appointment.   Percocet was sent to the pharmacy for severe pain, can be used every 6 hours as needed for the next 3 days for pain management, avoid use of heavy machinery or driving a car while taking this medicine.  Please make sure to stay adequately hydrated, this medication can cause constipation.  Discontinue use of the Xanax while taking the pain medicine.

## 2022-06-01 NOTE — ED Triage Notes (Signed)
Pt reports left hand pain x 1 day. States he was angry and punch a door with both hands yesterday and now has severe pain in the left hand and bruising in the palm of the left hand.

## 2022-12-09 NOTE — Progress Notes (Deleted)
Start time: End time:  Virtual Visit via Video Note  I connected with [name] on 12/09/22 by a video enabled telemedicine application and verified that I am speaking with the correct person using two identifiers.  Location: Patient: *** Provider: office   I discussed the limitations of evaluation and management by telemedicine and the availability of in person appointments. The patient expressed understanding and agreed to proceed.  History of Present Illness:  No chief complaint on file.   Seen once in this clinic by [facility] 10/2021, after a seizure, and with depression/anxiety.  No showed visit to neurologist, and f/u visit here.    Observations/Objective:  There were no vitals taken for this visit.   Assessment and Plan:   Follow Up Instructions:    I discussed the assessment and treatment plan with the patient. The patient was provided an opportunity to ask questions and all were answered. The patient agreed with the plan and demonstrated an understanding of the instructions.   The patient was advised to call back or seek an in-person evaluation if the symptoms worsen or if the condition fails to improve as anticipated.  I spent *** minutes dedicated to the care of this patient, including pre-visit review of records, face to face time, post-visit ordering of testing and documentation.    Lavonda Jumbo, MD

## 2022-12-10 ENCOUNTER — Telehealth: Payer: 59 | Admitting: Family Medicine

## 2022-12-12 ENCOUNTER — Ambulatory Visit: Payer: 59 | Admitting: Family Medicine

## 2022-12-19 ENCOUNTER — Encounter: Payer: Self-pay | Admitting: Nurse Practitioner

## 2023-06-20 ENCOUNTER — Encounter: Payer: 59 | Admitting: Nurse Practitioner

## 2023-08-01 ENCOUNTER — Encounter: Payer: Self-pay | Admitting: Nurse Practitioner

## 2023-08-01 ENCOUNTER — Ambulatory Visit (INDEPENDENT_AMBULATORY_CARE_PROVIDER_SITE_OTHER): Payer: 59 | Admitting: Nurse Practitioner

## 2023-08-01 VITALS — BP 124/78 | HR 78 | Ht 70.25 in | Wt 179.4 lb

## 2023-08-01 DIAGNOSIS — Z113 Encounter for screening for infections with a predominantly sexual mode of transmission: Secondary | ICD-10-CM | POA: Diagnosis not present

## 2023-08-01 DIAGNOSIS — F410 Panic disorder [episodic paroxysmal anxiety] without agoraphobia: Secondary | ICD-10-CM | POA: Diagnosis not present

## 2023-08-01 DIAGNOSIS — M545 Low back pain, unspecified: Secondary | ICD-10-CM | POA: Diagnosis not present

## 2023-08-01 DIAGNOSIS — Z0000 Encounter for general adult medical examination without abnormal findings: Secondary | ICD-10-CM | POA: Diagnosis not present

## 2023-08-01 DIAGNOSIS — M25561 Pain in right knee: Secondary | ICD-10-CM

## 2023-08-01 DIAGNOSIS — Z7189 Other specified counseling: Secondary | ICD-10-CM

## 2023-08-01 DIAGNOSIS — F4310 Post-traumatic stress disorder, unspecified: Secondary | ICD-10-CM

## 2023-08-01 DIAGNOSIS — Z23 Encounter for immunization: Secondary | ICD-10-CM | POA: Diagnosis not present

## 2023-08-01 DIAGNOSIS — G8929 Other chronic pain: Secondary | ICD-10-CM

## 2023-08-01 LAB — LIPID PANEL

## 2023-08-01 MED ORDER — ALPRAZOLAM 0.25 MG PO TABS: ORAL_TABLET | ORAL | 0 refills | Status: AC

## 2023-08-01 MED ORDER — SERTRALINE HCL 50 MG PO TABS: 50.0000 mg | ORAL_TABLET | Freq: Every day | ORAL | 3 refills | Status: AC

## 2023-08-01 NOTE — Patient Instructions (Addendum)
I have sent the order for the x-ray to [facility] at [address]. You can walk in to have this done. They are open from 8-5 Monday through Friday.  Wearing the brace is not a bad idea. This can help protect.   I sent the referral for counseling.   I want to follow-up with you in 3-4 weeks with a virtual visit to see how you are doing and make changes as needed.      Health Maintenance, Male Adopting a healthy lifestyle and getting preventive care are important in promoting health and wellness. Ask your health care provider about: The right schedule for you to have regular tests and exams. Things you can do on your own to prevent diseases and keep yourself healthy. What should I know about diet, weight, and exercise? Eat a healthy diet  Eat a diet that includes plenty of vegetables, fruits, low-fat dairy products, and lean protein. Do not eat a lot of foods that are high in solid fats, added sugars, or sodium. Maintain a healthy weight Body mass index (BMI) is a measurement that can be used to identify possible weight problems. It estimates body fat based on height and weight. Your health care provider can help determine your BMI and help you achieve or maintain a healthy weight. Get regular exercise Get regular exercise. This is one of the most important things you can do for your health. Most adults should: Exercise for at least 150 minutes each week. The exercise should increase your heart rate and make you sweat (moderate-intensity exercise). Do strengthening exercises at least twice a week. This is in addition to the moderate-intensity exercise. Spend less time sitting. Even light physical activity can be beneficial. Watch cholesterol and blood lipids Have your blood tested for lipids and cholesterol at 23 years of age, then have this test every 5 years. You may need to have your cholesterol levels checked more often if: Your lipid or cholesterol levels are  high. You are older than 23 years of age. You are at high risk for heart disease. What should I know about cancer screening? Many types of cancers can be detected Joshua Soto and may often be prevented. Depending on your health history and family history, you may need to have cancer screening at various ages. This may include screening for: Colorectal cancer. Prostate cancer. Skin cancer. Lung cancer. What should I know about heart disease, diabetes, and high blood pressure? Blood pressure and heart disease High blood pressure causes heart disease and increases the risk of stroke. This is more likely to develop in people who have high blood pressure readings or are overweight. Talk with your health care provider about your target blood pressure readings. Have your blood pressure checked: Every 3-5 years if you are 80-44 years of age. Every year if you are 17 years old or older. If you are between the ages of 58 and 45 and are a current or former smoker, ask your health care provider if you should have a one-time screening for abdominal aortic aneurysm (AAA). Diabetes Have regular diabetes screenings. This checks your fasting blood sugar level. Have the screening done: Once every three years after age 50 if you are at a normal weight and have a low risk for diabetes. More often and at a younger age if you are overweight or have a high risk for diabetes. What should I know about preventing infection? Hepatitis B If you have a higher risk for hepatitis B, you  should be screened for this virus. Talk with your health care provider to find out if you are at risk for hepatitis B infection. Hepatitis C Blood testing is recommended for: Everyone born from [birth year] through 1965. Anyone with known risk factors for hepatitis C. Sexually transmitted infections (STIs) You should be screened each year for STIs, including gonorrhea and chlamydia, if: You are sexually active and are younger than 23 years of  age. You are older than 23 years of age and your health care provider tells you that you are at risk for this type of infection. Your sexual activity has changed since you were last screened, and you are at increased risk for chlamydia or gonorrhea. Ask your health care provider if you are at risk. Ask your health care provider about whether you are at high risk for HIV. Your health care provider may recommend a prescription medicine to help prevent HIV infection. If you choose to take medicine to prevent HIV, you should first get tested for HIV. You should then be tested every 3 months for as long as you are taking the medicine. Follow these instructions at home: Alcohol use Do not drink alcohol if your health care provider tells you not to drink. If you drink alcohol: Limit how much you have to 0-2 drinks a day. Know how much alcohol is in your drink. In the [place] one drink equals one 12 oz bottle of beer (355 mL), one 5 oz glass of wine (148 mL), or one 1 oz glass of hard liquor (44 mL). Lifestyle Do not use any products that contain nicotine or tobacco. These products include cigarettes, chewing tobacco, and vaping devices, such as e-cigarettes. If you need help quitting, ask your health care provider. Do not use street drugs. Do not share needles. Ask your health care provider for help if you need support or information about quitting drugs. General instructions Schedule regular health, dental, and eye exams. Stay current with your vaccines. Tell your health care provider if: You often feel depressed. You have ever been abused or do not feel safe at home. Summary Adopting a healthy lifestyle and getting preventive care are important in promoting health and wellness. Follow your health care provider's instructions about healthy diet, exercising, and getting tested or screened for diseases. Follow your health care provider's instructions on monitoring your cholesterol and blood  pressure. This information is not intended to replace advice given to you by your health care provider. Make sure you discuss any questions you have with your health care provider. Document Revised: 10/24/2020 Document Reviewed: 10/24/2020 Elsevier Patient Education  2024 [employer].   For all adult patients, I recommend A well balanced diet low in saturated fats, cholesterol, and moderation in carbohydrates.   This can be as simple as monitoring portion sizes and cutting back on sugary beverages such as soda and juice to start with.    Daily water consumption of at least 64 ounces.  Physical activity at least 180 minutes per week, if just starting out.   This can be as simple as taking the stairs instead of the elevator and walking 2-3 laps around the office  purposefully every day.   STD protection, partner selection, and regular testing if high risk.  Limited consumption of alcoholic beverages if alcohol is consumed.  For women, I recommend no more than 7 alcoholic beverages per week, spread out throughout the week.  Avoid "binge" drinking or consuming large quantities of alcohol in one setting.  Please let me know if you feel you may need help with reduction or quitting alcohol consumption.   Avoidance of nicotine, if used.  Please let me know if you feel you may need help with reduction or quitting nicotine use.   Daily mental health attention.  This can be in the form of 5 minute daily meditation, prayer, journaling, yoga, reflection, etc.   Purposeful attention to your emotions and mental state can significantly improve your overall wellbeing  and  Health.  Please know that I am here to help you with all of your health care goals and am happy to work with you to find a solution that works best for you.  The greatest advice I have received with any changes in life are to take it one step at a time, that even means if all you can focus on is the next 60 seconds, then do that  and celebrate your victories.  With any changes in life, you will have set backs, and that is OK. The important thing to remember is, if you have a set back, it is not a failure, it is an opportunity to try again!  Health Maintenance Recommendations Screening Testing Mammogram Every 1 -2 years based on history and risk factors Starting at age 24 Pap Smear Ages 21-39 every 3 years Ages 43-65 every 5 years with HPV testing More frequent testing may be required based on results and history Colon Cancer Screening Every 1-10 years based on test performed, risk factors, and history Starting at age 36 Bone Density Screening Every 2-10 years based on history Starting at age 55 for women Recommendations for men differ based on medication usage, history, and risk factors AAA Screening One time ultrasound Men 43-22 years old who have every smoked Lung Cancer Screening Low Dose Lung CT every 12 months Age 61-80 years with a 30 pack-year smoking history who still smoke or who have quit within the last 15 years  Screening Labs Routine  Labs: Complete Blood Count (CBC), Complete Metabolic Panel (CMP), Cholesterol (Lipid Panel) Every 6-12 months based on history and medications May be recommended more frequently based on current conditions or previous results Hemoglobin A1c Lab Every 3-12 months based on history and previous results Starting at age 63 or earlier with diagnosis of diabetes, high cholesterol, BMI >26, and/or risk factors Frequent monitoring for patients with diabetes to ensure blood sugar control Thyroid Panel (TSH w/ T3 & T4) Every 6 months based on history, symptoms, and risk factors May be repeated more often if on medication HIV One time testing for all patients 72 and older May be repeated more frequently for patients with increased risk factors or exposure Hepatitis C One time testing for all patients 99 and older May be repeated more frequently for patients with  increased risk factors or exposure Gonorrhea, Chlamydia Every 12 months for all sexually active persons 13-24 years Additional monitoring may be recommended for those who are considered high risk or who have symptoms PSA Men 70-51 years old with risk factors Additional screening may be recommended from age 68-69 based on risk factors, symptoms, and history  Vaccine Recommendations Tetanus Booster All adults every 10 years Flu Vaccine All patients 6 months and older every year COVID Vaccine All patients 12 years and older Initial dosing with booster May recommend additional booster based on age and health history HPV Vaccine 2 doses all patients age 60-26 Dosing may be considered for patients over 26 Shingles Vaccine (Shingrix) 2 doses  all adults 55 years and older Pneumonia (Pneumovax 23) All adults 65 years and older May recommend earlier dosing based on health history Pneumonia (Prevnar 13) All adults 65 years and older Dosed 1 year after Pneumovax 23  Additional Screening, Testing, and Vaccinations may be recommended on an individualized basis based on family history, health history, risk factors, and/or exposure.
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
## 2023-08-02 DIAGNOSIS — F4310 Post-traumatic stress disorder, unspecified: Secondary | ICD-10-CM | POA: Insufficient documentation

## 2023-08-02 DIAGNOSIS — Z0000 Encounter for general adult medical examination without abnormal findings: Secondary | ICD-10-CM | POA: Insufficient documentation

## 2023-08-02 DIAGNOSIS — M545 Low back pain, unspecified: Secondary | ICD-10-CM | POA: Insufficient documentation

## 2023-08-02 DIAGNOSIS — G8929 Other chronic pain: Secondary | ICD-10-CM | POA: Insufficient documentation

## 2023-08-02 DIAGNOSIS — Z7189 Other specified counseling: Secondary | ICD-10-CM | POA: Insufficient documentation

## 2023-08-02 LAB — CBC WITH DIFFERENTIAL/PLATELET
Basophils Absolute: 0 10*3/uL (ref 0.0–0.2)
Basos: 0 %
EOS (ABSOLUTE): 0.2 10*3/uL (ref 0.0–0.4)
Eos: 2 %
Hematocrit: 46.9 % (ref 37.5–51.0)
Hemoglobin: 16 g/dL (ref 13.0–17.7)
Immature Grans (Abs): 0 10*3/uL (ref 0.0–0.1)
Immature Granulocytes: 0 %
Lymphocytes Absolute: 3.1 10*3/uL (ref 0.7–3.1)
Lymphs: 34 %
MCH: 30 pg (ref 26.6–33.0)
MCHC: 34.1 g/dL (ref 31.5–35.7)
MCV: 88 fL (ref 79–97)
Monocytes Absolute: 0.5 10*3/uL (ref 0.1–0.9)
Monocytes: 6 %
Neutrophils Absolute: 5.5 10*3/uL (ref 1.4–7.0)
Neutrophils: 58 %
Platelets: 267 10*3/uL (ref 150–450)
RBC: 5.33 x10E6/uL (ref 4.14–5.80)
RDW: 12.7 % (ref 11.6–15.4)
WBC: 9.3 10*3/uL (ref 3.4–10.8)

## 2023-08-02 LAB — URINALYSIS, ROUTINE W REFLEX MICROSCOPIC
Bilirubin, UA: NEGATIVE
Glucose, UA: NEGATIVE
Nitrite, UA: NEGATIVE
RBC, UA: NEGATIVE
Specific Gravity, UA: 1.024 (ref 1.005–1.030)
Urobilinogen, Ur: 1 mg/dL (ref 0.2–1.0)
pH, UA: 7 (ref 5.0–7.5)

## 2023-08-02 LAB — TSH: TSH: 0.434 u[IU]/mL — ABNORMAL LOW (ref 0.450–4.500)

## 2023-08-02 LAB — CMP14+EGFR
ALT: 14 IU/L (ref 0–44)
AST: 19 IU/L (ref 0–40)
Albumin: 5.1 g/dL (ref 4.3–5.2)
Alkaline Phosphatase: 88 IU/L (ref 44–121)
BUN/Creatinine Ratio: 8 — ABNORMAL LOW (ref 9–20)
BUN: 8 mg/dL (ref 6–20)
Bilirubin Total: 0.6 mg/dL (ref 0.0–1.2)
CO2: 25 mmol/L (ref 20–29)
Calcium: 10.4 mg/dL — ABNORMAL HIGH (ref 8.7–10.2)
Chloride: 99 mmol/L (ref 96–106)
Creatinine, Ser: 1.05 mg/dL (ref 0.76–1.27)
Globulin, Total: 2.8 g/dL (ref 1.5–4.5)
Glucose: 91 mg/dL (ref 70–99)
Potassium: 4.3 mmol/L (ref 3.5–5.2)
Sodium: 138 mmol/L (ref 134–144)
Total Protein: 7.9 g/dL (ref 6.0–8.5)
eGFR: 103 mL/min/{1.73_m2} (ref >59)

## 2023-08-02 LAB — LIPID PANEL
Cholesterol, Total: 152 mg/dL (ref 100–199)
HDL: 84 mg/dL (ref >39)
LDL CALC COMMENT:: 1.8 ratio (ref 0.0–5.0)
LDL Chol Calc (NIH): 54 mg/dL (ref 0–99)
Triglycerides: 70 mg/dL (ref 0–149)
VLDL Cholesterol Cal: 14 mg/dL (ref 5–40)

## 2023-08-02 LAB — MICROSCOPIC EXAMINATION
Bacteria, UA: NONE SEEN
Casts: NONE SEEN /[LPF]
Epithelial Cells (non renal): NONE SEEN /[HPF] (ref 0–10)
RBC, Urine: NONE SEEN /[HPF] (ref 0–2)

## 2023-08-02 LAB — HEMOGLOBIN A1C
Est. average glucose Bld gHb Est-mCnc: 117 mg/dL
Hgb A1c MFr Bld: 5.7 % — ABNORMAL HIGH (ref 4.8–5.6)

## 2023-08-02 NOTE — Assessment & Plan Note (Signed)
Frequent use of street-obtained Xanax, increasing to daily use. Discussed the addictive nature of Xanax, potential for dangerous contaminants, and the importance of using prescribed medication only. Emphasized the risks of withdrawal and the need for careful monitoring. - Prescribe 20 Xanax for the month, to be used sparingly and only when necessary - Monitor for signs of withdrawal and dependence - Verbal agreement to discontinuation of street-obtained Xanax

## 2023-08-02 NOTE — Assessment & Plan Note (Signed)
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
## 2023-08-02 NOTE — Assessment & Plan Note (Signed)
Chronic right knee pain since age 23, exacerbated by standing and walking. Pain is deep inside the knee, possibly involving the meniscus. Knee brace provides some relief. Discussed potential Joshua Soto arthritis due to past injury and the benefits of imaging. - Order x-ray of the right knee - Consider referral to an orthopedic specialist if x-ray is inconclusive or if symptoms persist

## 2023-08-02 NOTE — Assessment & Plan Note (Signed)
Intermittent low back pain, particularly after consuming alcohol or soda. Pain may be related to kidney function. Discussed the importance of hydration and the potential impact of alcohol and soda on kidney function. - Check kidney function - Perform urinalysis to rule out urinary tract infection

## 2023-08-04 LAB — STI PROFILE, CT/NG/TV
Chlamydia by NAA: POSITIVE — AB
Gonococcus by NAA: NEGATIVE
HCV Ab: NONREACTIVE
HIV Screen 4th Generation wRfx: NONREACTIVE
Hep B Core Total Ab: NEGATIVE
Hep B Surface Ab, Qual: NONREACTIVE
Hepatitis B Surface Ag: NEGATIVE
RPR Ser Ql: NONREACTIVE
Trich vag by NAA: NEGATIVE

## 2023-08-04 LAB — HEPATITIS C ANTIBODY

## 2023-08-04 LAB — HCV INTERPRETATION

## 2023-08-09 ENCOUNTER — Other Ambulatory Visit: Payer: Self-pay

## 2023-08-09 MED ORDER — DOXYCYCLINE HYCLATE 100 MG PO TABS: 100.0000 mg | ORAL_TABLET | Freq: Two times a day (BID) | ORAL | 0 refills | Status: AC

## 2023-08-21 ENCOUNTER — Ambulatory Visit: Payer: Self-pay | Admitting: Nurse Practitioner

## 2023-08-21 NOTE — Telephone Encounter (Signed)
  Chief Complaint: V/D Symptoms: V/D Frequency: 3 weeks Pertinent Negatives: Patient denies fever, URI sx Disposition: [] ED /[] Urgent Care (no appt availability in office) / [x] Appointment(In office/virtual)/ []  [facility]/ [] Home Care/ [] Refused Recommended Disposition /[] [facility]/ []  Follow-up with PCP Additional Notes: Pt c/o V/D x 3 weeks. Reports has been intermittent but has concerns about duration. Pt reports diarrhea is worse than vomiting. Pt endorses able to keep fluids down and intermittently vomits with eating and intermittent abdominal pain. Of note, pt has been able to go to work and tolerate abd pain episodes. Denies dehydration, fever, URI sx. Scheduled patient per protocol on 08/22/2023. Patient verbalized understanding and to call back/911 with worsening symptoms.    Copied from CRM [id number]. Topic: Clinical - Red Word Triage >> Aug 21, 2023  4:41 PM Kristie Cowman wrote: Red Word that prompted transfer to Nurse Triage: Abdominal pain, vomiting and diarrhea for the last three weeks and patient cannot eat. Every time he eats he vomits.  No temperature. Reason for Disposition  [1] MILD or MODERATE vomiting AND [2] present > 48 hours (2 days) (Exception: Mild vomiting with associated diarrhea.)  Answer Assessment - Initial Assessment Questions 1. VOMITING SEVERITY: "How many times have you vomited in the past 24 hours?"     - MILD:  1 - 2 times/day    - MODERATE: 3 - 5 times/day, decreased oral intake without significant weight loss or symptoms of dehydration    - SEVERE: 6 or more times/day, vomits everything or nearly everything, with significant weight loss, symptoms of dehydration      X1 today 2. ONSET: "When did the vomiting begin?"      X3 weeks 3. FLUIDS: "What fluids or food have you vomited up today?" "Have you been able to keep any fluids down?"     Reports able to keep down fluids, vomits occasionally with food  4. ABDOMEN PAIN: "Are your having  any abdomen pain?" If Yes : "How bad is it and what does it feel like?" (e.g., crampy, dull, intermittent, constant)      Constant abdominal, front, right side - reports sharp pain Reports abd pain events - severe where he cannot move, lasts 30-40 mins 5. DIARRHEA: "Is there any diarrhea?" If Yes, ask: "How many times today?"      5-6 x a day, brown water and loose BM 6. CONTACTS: "Is there anyone else in the family with the same symptoms?"      Sick contacts at work 7. CAUSE: "What do you think is causing your vomiting?"     Sick contacts at work 8. HYDRATION STATUS: "Any signs of dehydration?" (e.g., dry mouth [not only dry lips], too weak to stand) "When did you last urinate?"     Does feel a little dizzy now, but reports drinking a lot of water lately, about 1-2 liters a day 9. OTHER SYMPTOMS: "Do you have any other symptoms?" (e.g., fever, headache, vertigo, vomiting blood or coffee grounds, recent head injury)     Reports streaks of blood in emesis x 1  Protocols used: Vomiting-A-AH

## 2023-08-22 ENCOUNTER — Ambulatory Visit (INDEPENDENT_AMBULATORY_CARE_PROVIDER_SITE_OTHER): Admitting: Family Medicine

## 2023-08-22 VITALS — BP 120/82 | HR 74 | Wt 178.2 lb

## 2023-08-22 DIAGNOSIS — R112 Nausea with vomiting, unspecified: Secondary | ICD-10-CM | POA: Diagnosis not present

## 2023-08-22 LAB — CBC WITH DIFFERENTIAL/PLATELET
Basophils Absolute: 0 10*3/uL (ref 0.0–0.2)
Basos: 0 %
EOS (ABSOLUTE): 0.4 10*3/uL (ref 0.0–0.4)
Eos: 5 %
Hematocrit: 42.5 % (ref 37.5–51.0)
Hemoglobin: 14.3 g/dL (ref 13.0–17.7)
Immature Grans (Abs): 0 10*3/uL (ref 0.0–0.1)
Immature Granulocytes: 0 %
Lymphocytes Absolute: 3.3 10*3/uL — ABNORMAL HIGH (ref 0.7–3.1)
Lymphs: 36 %
MCH: 29.4 pg (ref 26.6–33.0)
MCHC: 33.6 g/dL (ref 31.5–35.7)
MCV: 87 fL (ref 79–97)
Monocytes Absolute: 0.6 10*3/uL (ref 0.1–0.9)
Monocytes: 7 %
Neutrophils Absolute: 4.6 10*3/uL (ref 1.4–7.0)
Neutrophils: 52 %
Platelets: 251 10*3/uL (ref 150–450)
RBC: 4.87 x10E6/uL (ref 4.14–5.80)
RDW: 12.4 % (ref 11.6–15.4)
WBC: 8.9 10*3/uL (ref 3.4–10.8)

## 2023-08-22 LAB — COMPREHENSIVE METABOLIC PANEL
ALT: 13 IU/L (ref 0–44)
AST: 21 IU/L (ref 0–40)
Albumin: 4.7 g/dL (ref 4.3–5.2)
Alkaline Phosphatase: 88 IU/L (ref 44–121)
BUN/Creatinine Ratio: 8 — ABNORMAL LOW (ref 9–20)
BUN: 8 mg/dL (ref 6–20)
Bilirubin Total: <0.2 mg/dL (ref 0.0–1.2)
CO2: 24 mmol/L (ref 20–29)
Calcium: 9.9 mg/dL (ref 8.7–10.2)
Chloride: 101 mmol/L (ref 96–106)
Creatinine, Ser: 1 mg/dL (ref 0.76–1.27)
Globulin, Total: 2.5 g/dL (ref 1.5–4.5)
Glucose: 86 mg/dL (ref 70–99)
Potassium: 4.4 mmol/L (ref 3.5–5.2)
Sodium: 140 mmol/L (ref 134–144)
Total Protein: 7.2 g/dL (ref 6.0–8.5)
eGFR: 109 mL/min/{1.73_m2} (ref >59)

## 2023-08-22 MED ORDER — ONDANSETRON 4 MG PO TBDP: 4.0000 mg | ORAL_TABLET | Freq: Three times a day (TID) | ORAL | 0 refills | Status: AC | PRN

## 2023-08-22 NOTE — Patient Instructions (Signed)
 Use the nausea medicine as needed and use Imodium and you can take up to 8 Imodium a day for the diarrhea.  Eat what ever you want.  Keep your fluids up

## 2023-08-22 NOTE — Progress Notes (Signed)
   Subjective:    Patient ID: Joshua Soto, male    DOB: 02-06-2001, 23 y.o.   MRN: [id number]  HPI He is here for evaluation of intermittent nausea and vomiting that he has had for the last 3 weeks.  This most recent episode has been going on for the last several days.  He is having 5-6 vomiting episodes per day as well as roughly 5 stools per day.  No fever or chills and abdominal pain when he does have the diarrhea.  He has not traveled anywhere.  Does not drink well water.  Cannot associate this with any particular foods.  He states that he has been able to work until yesterday.   Review of Systems     Objective:    Physical Exam Alert and in no distress. Tympanic membranes and canals are normal. Pharyngeal area is normal. Neck is supple without adenopathy or thyromegaly. Cardiac exam shows a regular sinus rhythm without murmurs or gallops. Lungs are clear to auscultation.        Assessment & Plan:  Nausea and vomiting, unspecified vomiting type - Plan: ondansetron (ZOFRAN-ODT) 4 MG disintegrating tablet, CBC with Differential/Platelet, Comprehensive metabolic panel   Use the nausea medicine as needed and use Imodium and you can take up to 8 Imodium a day for the diarrhea.  Eat what ever you want.  Keep your fluids up Will treat him symptomatically and if no improvement might need to do more stool evaluations.

## 2023-08-23 ENCOUNTER — Encounter: Payer: Self-pay | Admitting: Family Medicine

## 2023-08-29 ENCOUNTER — Encounter: Payer: 59 | Admitting: Nurse Practitioner

## 2023-08-29 DIAGNOSIS — Z91199 Patient's noncompliance with other medical treatment and regimen due to unspecified reason: Secondary | ICD-10-CM

## 2023-08-29 NOTE — Progress Notes (Signed)
Unable to contact patient at time of visit.

## 2023-08-30 ENCOUNTER — Ambulatory Visit (INDEPENDENT_AMBULATORY_CARE_PROVIDER_SITE_OTHER): Payer: Self-pay | Admitting: Licensed Clinical Social Worker

## 2023-08-30 DIAGNOSIS — Z91199 Patient's noncompliance with other medical treatment and regimen due to unspecified reason: Secondary | ICD-10-CM

## 2023-08-30 NOTE — Progress Notes (Signed)
 THERAPIST PROGRESS NOTE   Session Date: 08/30/2023  Session Time: 1100  Patient no-showed today's appointment; appointment was for Intake [facility] to establish therapy services.   Leisa Lenz, MSW, LCSW 08/30/2023,  2:50 PM
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
## 2024-07-16 ENCOUNTER — Encounter: Payer: Self-pay | Admitting: Internal Medicine

## 2024-08-11 ENCOUNTER — Encounter: Payer: Self-pay | Admitting: Nurse Practitioner
